# Patient Record
Sex: Male | Born: 1985 | Race: Black or African American | Hispanic: No | Marital: Single | Smoking: Never smoker
Health system: Southern US, Community
[De-identification: ages and names within clinical notes are randomized; demographics above are authoritative.]

## PROBLEM LIST (undated history)

## (undated) DIAGNOSIS — Z789 Other specified health status: Secondary | ICD-10-CM

## (undated) HISTORY — DX: Other specified health status: Z78.9

## (undated) HISTORY — PX: NO PAST SURGERIES: SHX2092

---
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
## 2006-11-14 ENCOUNTER — Emergency Department (HOSPITAL_COMMUNITY): Admission: EM | Admit: 2006-11-14 | Discharge: 2006-11-14 | Payer: Self-pay | Admitting: Emergency Medicine

## 2007-06-13 ENCOUNTER — Emergency Department (HOSPITAL_COMMUNITY): Admission: EM | Admit: 2007-06-13 | Discharge: 2007-06-13 | Payer: Self-pay | Admitting: Emergency Medicine
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
## 2011-08-19 ENCOUNTER — Telehealth: Payer: Self-pay

## 2011-08-19 NOTE — Telephone Encounter (Signed)
.  [facility] The patient called to request records of his past TB tests faxed to himself at [phone].  The patient may be reached by phone at [phone].

## 2011-08-20 NOTE — Telephone Encounter (Signed)
Faxed over 2011 and 2012 Tb Tests per the patients request.

## 2011-09-09 ENCOUNTER — Encounter (HOSPITAL_COMMUNITY): Payer: Self-pay | Admitting: Emergency Medicine

## 2011-09-09 ENCOUNTER — Emergency Department (HOSPITAL_COMMUNITY)
Admission: EM | Admit: 2011-09-09 | Discharge: 2011-09-09 | Disposition: A | Discharge status: Home / Self Care | Payer: BC Managed Care – PPO | Attending: Emergency Medicine | Admitting: Emergency Medicine

## 2011-09-09 ENCOUNTER — Emergency Department (HOSPITAL_COMMUNITY): Payer: BC Managed Care – PPO

## 2011-09-09 DIAGNOSIS — N50819 Testicular pain, unspecified: Secondary | ICD-10-CM

## 2011-09-09 DIAGNOSIS — N509 Disorder of male genital organs, unspecified: Secondary | ICD-10-CM | POA: Insufficient documentation

## 2011-09-09 LAB — URINALYSIS, ROUTINE W REFLEX MICROSCOPIC
Bilirubin Urine: NEGATIVE
Glucose, UA: NEGATIVE mg/dL
Hgb urine dipstick: NEGATIVE
Protein, ur: NEGATIVE mg/dL
Urobilinogen, UA: 1 mg/dL (ref 0.0–1.0)

## 2011-09-09 NOTE — Discharge Instructions (Signed)
Your exam today is negative for urinary tract infection or any structural abnormalities of your testicles.  Please follow-up with your primary care provider or with the urologist listed if symptoms persist or worsen.

## 2011-09-09 NOTE — ED Provider Notes (Signed)
History     CSN: [id number]  Arrival date & time 09/09/11  1907   First MD Initiated Contact with Patient 09/09/11 2034      Chief Complaint  Patient presents with  . Testicle Pain    (Consider location/radiation/quality/duration/timing/severity/associated sxs/prior treatment) Patient is a 26 y.o. male presenting with testicular pain.  Testicle Pain This is a recurrent problem. The current episode started 1 to 4 weeks ago. The problem occurs daily. The problem has been gradually worsening. Pertinent negatives include no fever or urinary symptoms. The symptoms are aggravated by nothing. He has tried nothing for the symptoms.  Patient reports frequent episodes of bilateral testicular discomfort, initially originating in left testicle and now presenting in right testicle.  Patient states his testicles feel "heavy".  Sexually active, monogamous, uses condoms.  Denies penile discharge, pain, dysuria.  History reviewed. No pertinent past medical history.  History reviewed. No pertinent past surgical history.  No family history on file.  History  Substance Use Topics  . Smoking status: Never Smoker   . Smokeless tobacco: Not on file  . Alcohol Use: Yes      Review of Systems  Constitutional: Negative for fever.  Genitourinary: Positive for testicular pain.  All other systems reviewed and are negative.    Allergies  Review of patient's allergies indicates no known allergies.  Home Medications  No current outpatient prescriptions on file.  BP 128/82  Pulse 58  Temp(Src) 98.3 F (36.8 C) (Oral)  Resp 16  SpO2 98%  Physical Exam  Nursing note and vitals reviewed. Constitutional: He is oriented to person, place, and time. He appears well-developed and well-nourished.  HENT:  Head: Normocephalic and atraumatic.  Eyes: Pupils are equal, round, and reactive to light.  Neck: Normal range of motion. Neck supple.  Cardiovascular: Normal rate, regular rhythm, normal heart  sounds and intact distal pulses.   Pulmonary/Chest: Effort normal and breath sounds normal.  Abdominal: Soft. Bowel sounds are normal.  Genitourinary: Penis normal. Cremasteric reflex is present. Right testis shows no mass and no swelling. Left testis shows tenderness. Left testis shows no mass and no swelling. No penile erythema or penile tenderness. No discharge found.  Musculoskeletal: Normal range of motion.  Neurological: He is alert and oriented to person, place, and time.  Skin: Skin is warm and dry.  Psychiatric: He has a normal mood and affect. His behavior is normal. Judgment and thought content normal.    ED Course  Procedures (including critical care time)   Labs Reviewed  URINALYSIS, ROUTINE W REFLEX MICROSCOPIC   US Scrotum  09/09/2011  *RADIOLOGY REPORT*  Clinical Data: Left testicular pain for 1 month.  SCROTAL ULTRASOUND DOPPLER ULTRASOUND OF THE TESTICLES  Technique:  Complete ultrasound examination of the testicles, epididymis, and other scrotal structures was performed.  Color and spectral Doppler ultrasound were also utilized to evaluate blood flow to the testicles.  Comparison:  None.  Findings:  The testicles are symmetric in size and echogenicity. The right testis measures 4.5 x 2.2 x 3.6 cm.  The left testis measures 4.5 x 2.1 x 3.1 cm. No testicular masses are seen.  There is a tiny calcification measuring about 1 mm diameter in the right testis. Both epididymal heads are unremarkable in appearance. There is no evidence of hydrocele, varicocele, or other extra- testicular abnormality.  Blood flow is seen within both testicles on color Doppler sonography.  Doppler spectral waveforms show both arterial and venous flow signal in both testicles.  IMPRESSION:  Tiny calcification in the right testis, likely to be of no clinical significance.  The examination is otherwise unremarkable.  No evidence of testicular mass or torsion.  Original Report Authenticated By: Marlon Pel, M.D.   [place] Art/ven Flow Abd Pelv Doppler  09/09/2011  *RADIOLOGY REPORT*  Clinical Data: Left testicular pain for 1 month.  SCROTAL ULTRASOUND DOPPLER ULTRASOUND OF THE TESTICLES  Technique:  Complete ultrasound examination of the testicles, epididymis, and other scrotal structures was performed.  Color and spectral Doppler ultrasound were also utilized to evaluate blood flow to the testicles.  Comparison:  None.  Findings:  The testicles are symmetric in size and echogenicity. The right testis measures 4.5 x 2.2 x 3.6 cm.  The left testis measures 4.5 x 2.1 x 3.1 cm. No testicular masses are seen.  There is a tiny calcification measuring about 1 mm diameter in the right testis. Both epididymal heads are unremarkable in appearance. There is no evidence of hydrocele, varicocele, or other extra- testicular abnormality.  Blood flow is seen within both testicles on color Doppler sonography.  Doppler spectral waveforms show both arterial and venous flow signal in both testicles.  IMPRESSION: Tiny calcification in the right testis, likely to be of no clinical significance.  The examination is otherwise unremarkable.  No evidence of testicular mass or torsion.  Original Report Authenticated By: Marlon Pel, M.D.     1. Testicle pain       MDM          Jimmye Norman, NP 09/09/11 [phone]

## 2011-09-09 NOTE — ED Notes (Signed)
Patient transported to Ultrasound 

## 2011-09-09 NOTE — ED Notes (Signed)
Patient states developed left testicle one month ago and pain worsening overtime and now started to have pain right testicle.  Patient 4-5/10 throbbing pain.  Denies dysuria how pain tip of penis.

## 2011-09-10 NOTE — ED Provider Notes (Signed)
Medical screening examination/treatment/procedure(s) were performed by non-physician practitioner and as supervising physician I was immediately available for consultation/collaboration.   Gwyneth Sprout, MD 09/10/11 2155

## 2011-09-27 ENCOUNTER — Emergency Department (HOSPITAL_COMMUNITY)
Admission: EM | Admit: 2011-09-27 | Discharge: 2011-09-27 | Disposition: A | Discharge status: Home / Self Care | Payer: BC Managed Care – PPO | Source: Home / Self Care | Attending: Emergency Medicine | Admitting: Emergency Medicine

## 2011-09-27 ENCOUNTER — Emergency Department (INDEPENDENT_AMBULATORY_CARE_PROVIDER_SITE_OTHER): Payer: BC Managed Care – PPO

## 2011-09-27 ENCOUNTER — Encounter (HOSPITAL_COMMUNITY): Payer: Self-pay | Admitting: Emergency Medicine

## 2011-09-27 DIAGNOSIS — K5900 Constipation, unspecified: Secondary | ICD-10-CM

## 2011-09-27 DIAGNOSIS — K2970 Gastritis, unspecified, without bleeding: Secondary | ICD-10-CM

## 2011-09-27 DIAGNOSIS — K2990 Gastroduodenitis, unspecified, without bleeding: Secondary | ICD-10-CM

## 2011-09-27 MED ORDER — BISACODYL 5 MG PO TBEC: 5.0000 mg | DELAYED_RELEASE_TABLET | Freq: Two times a day (BID) | ORAL | Status: AC

## 2011-09-27 MED ORDER — GI COCKTAIL ~~LOC~~
ORAL | Status: AC
  Filled 2011-09-27: qty 30

## 2011-09-27 MED ORDER — GLYCERIN (LAXATIVE) 2 G RE SUPP: 1.0000 | Freq: Once | RECTAL | Status: DC | PRN

## 2011-09-27 MED ORDER — POLYETHYLENE GLYCOL 3350 17 GM/SCOOP PO POWD: 17.0000 g | Freq: Every day | ORAL | Status: AC

## 2011-09-27 MED ORDER — GI COCKTAIL ~~LOC~~
30.0000 mL | Freq: Once | ORAL | Status: AC
  Administered 2011-09-27: 30 mL via ORAL

## 2011-09-27 MED ORDER — FAMOTIDINE 20 MG PO TABS: 20.0000 mg | ORAL_TABLET | Freq: Two times a day (BID) | ORAL | Status: DC

## 2011-09-27 MED ORDER — PANTOPRAZOLE SODIUM 40 MG PO TBEC: 40.0000 mg | DELAYED_RELEASE_TABLET | Freq: Every day | ORAL | Status: DC

## 2011-09-27 NOTE — ED Notes (Signed)
C/o patients says he been having left chest pain, radiates to the back, started about a month ago, pain intermittent. Pt says he been feeling weak, trouble breathing, better when standing or laying down, worst when sitting. Pt eats,drinks fine. Pt exercises regularly.

## 2011-09-27 NOTE — Discharge Instructions (Signed)
Drink extra fluids. It may take up to 3 days for the miralax to take effect. may also drink prune and apple juice. Return if you have a fever, if you start having severe abdominal pain, a fever >100.4, or any other concerns.   Go to www.goodrx.com to look up your medications. This will give you a list of where you can find your prescriptions at the most affordable prices.   

## 2011-09-27 NOTE — ED Provider Notes (Signed)
History     CSN: [id number]  Arrival date & time 09/27/11  1556   First MD Initiated Contact with Patient 09/27/11 1610      Chief Complaint  Patient presents with  . Chest Pain    (Consider location/radiation/quality/duration/timing/severity/associated sxs/prior treatment) HPI Comments: Pt with intermittent LUQ/gastric sharp pain over the past month. States gets worse and becomes nauseous with fasting, better with eating. Worse with sitting up straight, better with lying down and standing up and with aspirin. also took ibu w/o relief.. Not affected with exertion. States feels "pressure" in the mid back, which is better with taking a deep breath in.  No v, diaphoresis. No urinary complaints, BM have been "larger"  Than usual but normal color. Last time was as morning, and was normal for him. States that his abdomen has gotten "a little bigger," but denies significant distention. No alcohol use, excessive NSAID use. Patient is not a smoker. No h/o abd surgeries.  No FH CAD, MI.   ROS as noted in HPI. All other ROS negative.   Patient is a 26 y.o. male presenting with abdominal pain. The history is provided by the patient. No language interpreter was used.  Abdominal Pain The primary symptoms of the illness include abdominal pain. The current episode started more than 2 days ago. The onset of the illness was sudden. The problem has been gradually worsening.  The patient has not had a change in bowel habit. Symptoms associated with the illness do not include chills, anorexia, diaphoresis, constipation, urgency, hematuria, frequency or back pain. Significant associated medical issues do not include PUD, GERD or diabetes.    History reviewed. No pertinent past medical history.  History reviewed. No pertinent past surgical history.  History reviewed. No pertinent family history.  History  Substance Use Topics  . Smoking status: Never Smoker   . Smokeless tobacco: Not on file  . Alcohol  Use: Yes     occ      Review of Systems  Constitutional: Negative for chills and diaphoresis.  Gastrointestinal: Positive for abdominal pain. Negative for constipation and anorexia.  Genitourinary: Negative for urgency, frequency and hematuria.  Musculoskeletal: Negative for back pain.    Allergies  Review of patient's allergies indicates no known allergies.  Home Medications   Current Outpatient Rx  Name Route Sig Dispense Refill  . ASPIRIN 325 MG PO TABS Oral Take 325 mg by mouth daily.    Marland Kitchen BISACODYL 5 MG PO TBEC Oral Take 1 tablet (5 mg total) by mouth 2 (two) times daily. 14 tablet 0  . FAMOTIDINE 20 MG PO TABS Oral Take 1 tablet (20 mg total) by mouth 2 (two) times daily. 40 tablet 0  . GLYCERIN (LAXATIVE) 2 G RE SUPP Rectal Place 1 suppository rectally once as needed (constipation). 12 suppository 0  . PANTOPRAZOLE SODIUM 40 MG PO TBEC Oral Take 1 tablet (40 mg total) by mouth daily. 20 tablet 0  . POLYETHYLENE GLYCOL 3350 PO POWD Oral Take 17 g by mouth daily. 255 g 0    BP 134/76  Pulse 67  Temp 99.6 F (37.6 C) (Oral)  Resp 20  SpO2 100%  Physical Exam  Nursing note and vitals reviewed. Constitutional: He is oriented to person, place, and time. He appears well-developed and well-nourished.  HENT:  Head: Normocephalic and atraumatic.  Eyes: Conjunctivae and EOM are normal.  Neck: Normal range of motion.  Cardiovascular: Normal rate, regular rhythm, normal heart sounds and intact distal pulses.  Exam reveals no gallop and no friction rub.   No murmur heard. Pulmonary/Chest: Effort normal and breath sounds normal.  Abdominal: Soft. Normal appearance and bowel sounds are normal. He exhibits no distension and no mass. There is tenderness in the left upper quadrant. There is no rigidity, no rebound, no guarding, no CVA tenderness, no tenderness at McBurney's point and negative Murphy's sign.    Musculoskeletal: Normal range of motion.  Neurological: He is alert  and oriented to person, place, and time.  Skin: Skin is warm and dry. No rash noted.  Psychiatric: He has a normal mood and affect. His behavior is normal. Judgment and thought content normal.    ED Course  Procedures (including critical care time)   Labs Reviewed  POCT H PYLORI SCREEN  LAB REPORT - SCANNED   Dg Abd Acute W/chest  09/27/2011  *RADIOLOGY REPORT*  Clinical Data: Left lower quadrant abdominal pain.  Abdominal distention.  Left-sided chest pain.  ACUTE ABDOMEN SERIES (ABDOMEN 2 VIEW & CHEST 1 VIEW)  Comparison: Lumbar radiographs dated 11/14/2006  Findings: Heart size and vascularity are normal and the lungs are clear.  No free air or free fluid in the abdomen.  The bowel gas pattern is normal.  Stool scattered throughout the colon.  No abnormal abdominal calcifications.  No osseous abnormality.  IMPRESSION: Benign-appearing abdomen and chest.  Original Report Authenticated By: Gwynn Burly, M.D.   Results for orders placed during the hospital encounter of 09/27/11  POCT H PYLORI SCREEN      Component Value Range   H. PYLORI SCREEN, POC NEGATIVE  NEGATIVE     1. Constipation   2. Gastritis     MDM   EKG: Sinus bradycardia, rate 48, normal intervals, normal axis. LVH. Early repolarization. No previous EKGs for comparison.  EKG is normal, H. pylori negative. X-ray reviewed by myself. Report per radiology. Abdomen benign, no evidence of surgical abdomen at this time. H&P most consistent with gastritis and constipation. Doubt cardiac etiology of his pain, as EKG was obtained while patient symptomatic, and he has no other risk factors for ACS. X-ray shows diffuse constipation. Will start him on H2/PPI for gastritis, and time some stool softeners, MiraLax for his constipation. Discussed imaging, MDM, signs and symptoms that should prompt his return. Patient agrees with plan.  Luiz Blare, MD 09/28/11 [phone]

## 2012-01-23 ENCOUNTER — Ambulatory Visit (INDEPENDENT_AMBULATORY_CARE_PROVIDER_SITE_OTHER): Payer: BC Managed Care – PPO | Admitting: Family Medicine

## 2012-01-23 VITALS — BP 128/74 | HR 51 | Temp 98.0°F | Resp 17 | Ht 73.0 in | Wt 177.0 lb

## 2012-01-23 DIAGNOSIS — N50819 Testicular pain, unspecified: Secondary | ICD-10-CM

## 2012-01-23 DIAGNOSIS — R350 Frequency of micturition: Secondary | ICD-10-CM

## 2012-01-23 DIAGNOSIS — Z7251 High risk heterosexual behavior: Secondary | ICD-10-CM

## 2012-01-23 DIAGNOSIS — N509 Disorder of male genital organs, unspecified: Secondary | ICD-10-CM

## 2012-01-23 DIAGNOSIS — M549 Dorsalgia, unspecified: Secondary | ICD-10-CM

## 2012-01-23 LAB — POCT UA - MICROSCOPIC ONLY
Bacteria, U Microscopic: NEGATIVE
Casts, Ur, LPF, POC: NEGATIVE
Mucus, UA: NEGATIVE
Yeast, UA: NEGATIVE

## 2012-01-23 LAB — POCT URINALYSIS DIPSTICK
Bilirubin, UA: NEGATIVE
Blood, UA: NEGATIVE
Glucose, UA: NEGATIVE
Nitrite, UA: NEGATIVE
Spec Grav, UA: 1.025
pH, UA: 6.5

## 2012-01-23 NOTE — Progress Notes (Signed)
Subjective:    Patient ID: Tyler Tran, male    DOB: Aug 27, 1985, 26 y.o.   MRN: [id number]  HPI Tyler Tran is a 26 y.o. male Past 6 - 8 months with back problems, seen in the ER.  Told was constipated.  Seen again in the emergency room for different feeling in testicles - felt softer - had ultrasound done in ER, told was normal.   Then felt like testicles were hanging lower and could feel veins.  Did research online - worried that has std or other problem. Sexually active - 1 partner in past year, last sexually active 3 months ago.  No penile discharge.  Slight dribble after urinating.  Upset stomach at times or after workout. initially had some soreness in testicles, now only episodic sharp pains. Not continuous as in past.   No treatments.  Scrotal ultrasound 09/09/11: Findings: The testicles are symmetric in size and echogenicity.  The right testis measures 4.5 x 2.2 x 3.6 cm. The left testis  measures 4.5 x 2.1 x 3.1 cm. No testicular masses are seen. There  is a tiny calcification measuring about 1 mm diameter in the right  testis. Both epididymal heads are unremarkable in appearance.  There is no evidence of hydrocele, varicocele, or other extra-  testicular abnormality.  Blood flow is seen within both testicles on color Doppler  sonography. Doppler spectral waveforms show both arterial and  venous flow signal in both testicles.  IMPRESSION:  Tiny calcification in the right testis, likely to be of no clinical  significance. The examination is otherwise unremarkable. No  evidence of testicular mass or torsion.    [employer], broadcasting/film/video.  Nonsmoker. No herbal supplements, only protein shakes with workouts.   Review of Systems  Genitourinary: Positive for frequency and testicular pain. Negative for dysuria, urgency, hematuria, flank pain, discharge, penile swelling, scrotal swelling and penile pain.  Musculoskeletal: Negative for back pain.  Skin: Negative for rash.        Objective:   Physical Exam  Constitutional: He is oriented to person, place, and time. He appears well-developed and well-nourished.  Pulmonary/Chest: Effort normal.  Abdominal: Soft. Bowel sounds are normal. He exhibits no distension. There is no tenderness. There is no CVA tenderness. Hernia confirmed negative in the right inguinal area and confirmed negative in the left inguinal area.  Genitourinary: Penis normal. Right testis shows no mass, no swelling and no tenderness. Left testis shows no mass, no swelling and no tenderness.       small nontender lymph nodes bilaterral inguinal fold.   Musculoskeletal: Normal range of motion.  Neurological: He is alert and oriented to person, place, and time.  Skin: Skin is warm and dry. No rash noted.  Psychiatric: He has a normal mood and affect. His behavior is normal.      Results for orders placed in visit on 01/23/12  POCT UA - MICROSCOPIC ONLY      Component Value Range   WBC, Ur, HPF, POC 0-2     RBC, urine, microscopic 0-1     Bacteria, U Microscopic neg     Mucus, UA neg     Epithelial cells, urine per micros 0-1     Crystals, Ur, HPF, POC neg     Casts, Ur, LPF, POC neg     Yeast, UA neg    POCT URINALYSIS DIPSTICK      Component Value Range   Color, UA yellow     Clarity, UA clear  Glucose, UA neg     Bilirubin, UA neg     Ketones, UA neg     Spec Grav, UA 1.025     Blood, UA neg     pH, UA 6.5     Protein, UA neg     Urobilinogen, UA 0.2     Nitrite, UA neg     Leukocytes, UA Negative         Assessment & Plan:

## 2012-01-23 NOTE — Patient Instructions (Signed)
Your should receive a call or letter about your lab results within the next week to 10 days.  Return to the clinic or go to the nearest emergency room if any of your symptoms worsen or new symptoms occur.  

## 2012-01-24 LAB — PSA: PSA: 1.34 ng/mL (ref <=4.00)

## 2012-01-24 LAB — GC/CHLAMYDIA PROBE AMP, URINE: Chlamydia, Swab/Urine, PCR: NEGATIVE

## 2012-01-24 LAB — RPR

## 2012-06-03 ENCOUNTER — Ambulatory Visit (INDEPENDENT_AMBULATORY_CARE_PROVIDER_SITE_OTHER): Payer: BC Managed Care – PPO | Admitting: Family Medicine

## 2012-06-03 ENCOUNTER — Ambulatory Visit: Payer: BC Managed Care – PPO

## 2012-06-03 VITALS — BP 112/62 | HR 54 | Temp 97.4°F | Resp 16 | Ht 73.5 in | Wt 178.0 lb

## 2012-06-03 DIAGNOSIS — M542 Cervicalgia: Secondary | ICD-10-CM

## 2012-06-03 MED ORDER — CYCLOBENZAPRINE HCL 10 MG PO TABS: 10.0000 mg | ORAL_TABLET | Freq: Two times a day (BID) | ORAL | Status: DC | PRN

## 2012-06-03 NOTE — Progress Notes (Signed)
[facility] [address], [place] [address] [phone] [phone]  Date:  06/03/2012   Name:  Tyler Tran   DOB:  Oct 17, 1985   MRN:  [id number]  PCP:  Default, Provider, MD    Chief Complaint: Neck Pain   History of Present Illness:  Tyler Tran is a 27 y.o. very pleasant male patient who presents with the following:  He played basketball last night.  He noted a slight pain in his neck while playing.  He applied ice when he got home, then heat.  This am the pain was a lot worse.  It hurts a lot to move "in certain directions" or with certain movements.     It hurts on the right side of the neck only.   He did not notice any particular injury to his neck, no falls or other trauma No pain down his arms, no numbness or tingling in his arms, hands or fingers.    Generally he is quite healthy.   No headache, feels well otherwise  He last took ibuprofen last night- around 10 pm.  He was given 800 mg of ibuprofen here but has not yet taken it- told him it was fine to take the medication  There is no problem list on file for this patient.   No past medical history on file.  No past surgical history on file.  History  Substance Use Topics  . Smoking status: Never Smoker   . Smokeless tobacco: Not on file  . Alcohol Use: Yes     Comment: occ    No family history on file.  No Known Allergies  Medication list has been reviewed and updated.  Current Outpatient Prescriptions on File Prior to Visit  Medication Sig Dispense Refill  . aspirin 325 MG tablet Take 325 mg by mouth daily.      . famotidine (PEPCID) 20 MG tablet Take 1 tablet (20 mg total) by mouth 2 (two) times daily.  40 tablet  0  . glycerin adult (GLYCERIN ADULT) 2 G SUPP Place 1 suppository rectally once as needed (constipation).  12 suppository  0  . pantoprazole (PROTONIX) 40 MG tablet Take 1 tablet (40 mg total) by mouth daily.  20 tablet  0   No current facility-administered  medications on file prior to visit.    Review of Systems:  As per HPI- otherwise negative.   Physical Examination: Filed Vitals:   06/03/12 0855  BP: 112/62  Pulse: 54  Temp: 97.4 F (36.3 C)  Resp: 16   Filed Vitals:   06/03/12 0855  Height: 6' 1.5" (1.867 m)  Weight: 178 lb (80.74 kg)   Body mass index is 23.16 kg/(m^2). Ideal Body Weight: Weight in (lb) to have BMI = 25: 191.7  GEN: WDWN, NAD, Non-toxic, A & O x 3, looks well HEENT: Atraumatic, Normocephalic. Neck supple. No masses, No LAD. Bilateral TM partially obscured by cerumen, oropharynx normal.  PEERL,EOMI.   Ears and Nose: No external deformity. CV: RRR, No M/G/R. No JVD. No thrill. No extra heart sounds. PULM: CTA B, no wheezes, crackles, rhonchi. No retractions. No resp. distress. No accessory muscle use.Marland Kitchen EXTR: No c/c/e NEURO Normal gait.  PSYCH: Normally interactive. Conversant. Not depressed or anxious appearing.  Calm demeanor.  He is tender in the right paraspinous muscles of the neck.  He has 45 degrees of roation to the left, about 30 degrees to the right.  Extension and flexion are slightly restricted  by pain.   Normal strength, sensation, and dtr all extrmities, normal facial motion and sensation.  Normal RAM, normal shin rub  [facility] reading (PRIMARY) by  Dr. Patsy Lager. Cervical spine: normal  *RADIOLOGY REPORT*  Clinical Data: Neck pain. No acute injury.  CERVICAL SPINE - COMPLETE 4+ VIEW  Comparison: None.  Findings: The prevertebral soft tissues are normal. The alignment is anatomic through T1. There is no evidence of acute fracture or subluxation. The C1-C2 articulation appears normal in the AP projection. No osseous foraminal stenosis is demonstrated on the oblique views.  IMPRESSION: Normal cervical spine radiographs. No evidence of acute fracture, subluxation or static signs instability.  Soft cervical collar feels "great" to him   Assessment and Plan: Neck pain - Plan: DG Cervical  Spine Complete, cyclobenzaprine (FLEXERIL) 10 MG tablet  Crick in the neck- treat with cervical collar, heat/ ice and flexeril.   See patient instructions for more details.     Abbe Amsterdam, MD

## 2012-06-03 NOTE — Patient Instructions (Addendum)
Wear the collar as needed, and continue to use heat/ ice for your neck.    Use the muscle relaxer as needed, but be aware it can cause drowsiness.  Let me know if you are not improved within 24 - 48 hours, sooner if you develop a severe headache or any other symptoms

## 2013-08-04 ENCOUNTER — Ambulatory Visit: Payer: BC Managed Care – PPO

## 2013-12-16 ENCOUNTER — Telehealth: Payer: Self-pay

## 2013-12-16 NOTE — Telephone Encounter (Signed)
Patient left voicemail on Thursday 12/15/13 requesting status of his request for Medical Records.  Called patient and LMOVM for him to return the call.    [phone]

## 2013-12-20 NOTE — Telephone Encounter (Signed)
Records request has already been processed. I personally left voicemail for patient last week.

## 2022-12-30 ENCOUNTER — Ambulatory Visit: Payer: BC Managed Care – PPO | Admitting: Family Medicine

## 2023-01-07 ENCOUNTER — Encounter: Payer: Self-pay | Admitting: Family Medicine

## 2023-01-07 ENCOUNTER — Ambulatory Visit: Payer: BC Managed Care – PPO | Admitting: Family Medicine

## 2023-01-07 ENCOUNTER — Other Ambulatory Visit: Payer: Self-pay | Admitting: Family Medicine

## 2023-01-07 VITALS — BP 121/80 | HR 60 | Temp 98.9°F | Ht 74.0 in | Wt 235.4 lb

## 2023-01-07 DIAGNOSIS — E785 Hyperlipidemia, unspecified: Secondary | ICD-10-CM

## 2023-01-07 DIAGNOSIS — Z0000 Encounter for general adult medical examination without abnormal findings: Secondary | ICD-10-CM

## 2023-01-07 DIAGNOSIS — Z0001 Encounter for general adult medical examination with abnormal findings: Secondary | ICD-10-CM

## 2023-01-07 DIAGNOSIS — Z23 Encounter for immunization: Secondary | ICD-10-CM | POA: Diagnosis not present

## 2023-01-07 DIAGNOSIS — D72829 Elevated white blood cell count, unspecified: Secondary | ICD-10-CM

## 2023-01-07 DIAGNOSIS — Z1159 Encounter for screening for other viral diseases: Secondary | ICD-10-CM

## 2023-01-07 LAB — COMPREHENSIVE METABOLIC PANEL
ALT: 22 U/L (ref 0–53)
AST: 21 U/L (ref 0–37)
Albumin: 4.5 g/dL (ref 3.5–5.2)
Alkaline Phosphatase: 47 U/L (ref 39–117)
BUN: 13 mg/dL (ref 6–23)
CO2: 32 mEq/L (ref 19–32)
Calcium: 9.7 mg/dL (ref 8.4–10.5)
Chloride: 98 mEq/L (ref 96–112)
Creatinine, Ser: 1.19 mg/dL (ref 0.40–1.50)
GFR: 78.95 mL/min (ref >60.00)
Glucose, Bld: 91 mg/dL (ref 70–99)
Potassium: 4.2 mEq/L (ref 3.5–5.1)
Sodium: 137 mEq/L (ref 135–145)
Total Bilirubin: 0.5 mg/dL (ref 0.2–1.2)
Total Protein: 7.8 g/dL (ref 6.0–8.3)

## 2023-01-07 LAB — CBC
HCT: 47 % (ref 39.0–52.0)
Hemoglobin: 15.4 g/dL (ref 13.0–17.0)
MCHC: 32.8 g/dL (ref 30.0–36.0)
MCV: 94.3 fl (ref 78.0–100.0)
Platelets: 212 10*3/uL (ref 150.0–400.0)
RBC: 4.98 Mil/uL (ref 4.22–5.81)
RDW: 13 % (ref 11.5–15.5)
WBC: 2.7 10*3/uL — ABNORMAL LOW (ref 4.0–10.5)

## 2023-01-07 LAB — LIPID PANEL
Cholesterol: 234 mg/dL — ABNORMAL HIGH (ref 0–200)
HDL: 45.9 mg/dL (ref >39.00)
LDL Cholesterol: 130 mg/dL — ABNORMAL HIGH (ref 0–99)
NonHDL: 188.02
Total CHOL/HDL Ratio: 5
Triglycerides: 291 mg/dL — ABNORMAL HIGH (ref 0.0–149.0)
VLDL: 58.2 mg/dL — ABNORMAL HIGH (ref 0.0–40.0)

## 2023-01-07 NOTE — Progress Notes (Signed)
Chief Complaint  Patient presents with   New Patient (Initial Visit)   Insomnia    Well Male Tyler Tran is here for a complete physical.   His last physical was >1 year ago.  Current diet: in general, a "fair" diet.   Current exercise: lifting wts Weight trend: stable Fatigue out of ordinary? No. Seat belt? Yes.   Advanced directive? No  Health maintenance Tetanus- No HIV- Yes Hep C- No  Past Medical History:  Diagnosis Date   No known health problems      Past Surgical History:  Procedure Laterality Date   NO PAST SURGERIES      Medications  Takes no meds routinely.     Allergies No Known Allergies  Family History Family History  Problem Relation Age of Onset   Heart disease Maternal Grandmother    Heart disease Paternal Grandmother    Diabetes Paternal Grandmother    Cancer Neg Hx     Review of Systems: Constitutional: no fevers or chills Eye:  no recent significant change in vision Ear/Nose/Mouth/Throat:  Ears:  no hearing loss Nose/Mouth/Throat:  no complaints of nasal congestion, no sore throat Cardiovascular:  no chest pain Respiratory:  no shortness of breath Gastrointestinal:  no abdominal pain, no change in bowel habits GU:  Male: negative for dysuria Musculoskeletal/Extremities:  no pain of the joints Integumentary (Skin/Breast):  no abnormal skin lesions reported Neurologic:  no headaches Endocrine: No unexpected weight changes Hematologic/Lymphatic:  no night sweats  Exam BP 121/80 (BP Location: Left Arm, Patient Position: Sitting, Cuff Size: Large)   Pulse 60   Temp 98.9 F (37.2 C) (Oral)   Ht 6\' 2"  (1.88 m)   Wt 235 lb 6 oz (106.8 kg)   SpO2 98%   BMI 30.22 kg/m  General:  well developed, well nourished, in no apparent distress Skin:  no significant moles, warts, or growths Head:  no masses, lesions, or tenderness Eyes:  pupils equal and round, sclera anicteric without injection Ears:  canals without lesions, TMs shiny  without retraction, no obvious effusion, no erythema Nose:  nares patent, mucosa normal Throat/Pharynx:  lips and gingiva without lesion; tongue and uvula midline; non-inflamed pharynx; no exudates or postnasal drainage Neck: neck supple without adenopathy, thyromegaly, or masses Lungs:  clear to auscultation, breath sounds equal bilaterally, no respiratory distress Cardio:  regular rate and rhythm, no bruits, no LE edema Abdomen:  abdomen soft, nontender; bowel sounds normal; no masses or organomegaly Genital (male): Deferred Rectal: Deferred Musculoskeletal:  symmetrical muscle groups noted without atrophy or deformity Extremities:  no clubbing, cyanosis, or edema, no deformities, no skin discoloration Neuro:  gait normal; deep tendon reflexes normal and symmetric Psych: well oriented with normal range of affect and appropriate judgment/insight  Assessment and Plan  Well adult exam - Plan: CBC, Comprehensive metabolic panel, Lipid panel  Encounter for hepatitis C screening test for low risk patient - Plan: Hepatitis C antibody   Well 37 y.o. male. Counseled on diet and exercise. Self testicular exams recommended at least monthly.  Tdap today.  Mentioned insomnia. Offered medication, referral. He politely declined. He is following w a therapist.  He would like to think about it and let me know. Advanced directive form provided today.  Other orders as above. Follow up in 1 year pending the above workup. The patient voiced understanding and agreement to the plan.  Jilda Roche [place], DO 01/07/23 1:26 PM

## 2023-01-07 NOTE — Patient Instructions (Addendum)
Give [place] 2-3 business days to get the results of your labs back.   Keep the diet clean and stay active.  Aim to do some physical exertion for 150 minutes per week. This is typically divided into 5 days per week, 30 minutes per day. The activity should be enough to get your heart rate up. Anything is better than nothing if you have time constraints.  Start stretching routinely.   Do monthly self testicular checks in the shower. You are feeling for lumps/bumps that don't belong. If you feel anything like this, let me know!  Please get me a copy of your advanced directive form at your convenience.   If you want to see a sleep specialist or consider starting a medication, please let me know.   Sleep Hygiene Tips: Do not watch TV or look at screens within 1 hour of going to bed. If you do, make sure there is a blue light filter (nighttime mode) involved. Try to go to bed around the same time every night. Wake up at the same time within 1 hour of regular time. Ex: If you wake up at 7 AM for work, do not sleep past 8 AM on days that you don't work. Do not drink alcohol before bedtime. Do not consume caffeine-containing beverages after noon or within 9 hours of intended bedtime. Get regular exercise/physical activity in your life, but not within 2 hours of planned bedtime. Do not take naps.  Do not eat within 2 hours of planned bedtime. Melatonin, 3-5 mg 30-60 minutes before planned bedtime may be helpful.  The bed should be for sleep or sex only. If after 20-30 minutes you are unable to fall asleep, get up and do something relaxing. Do this until you feel ready to go to sleep again.   Let us know if you need anything.

## 2023-01-08 LAB — HEPATITIS C ANTIBODY: Hepatitis C Ab: NONREACTIVE

## 2023-02-18 ENCOUNTER — Other Ambulatory Visit (INDEPENDENT_AMBULATORY_CARE_PROVIDER_SITE_OTHER): Payer: BC Managed Care – PPO

## 2023-02-18 DIAGNOSIS — D72829 Elevated white blood cell count, unspecified: Secondary | ICD-10-CM | POA: Diagnosis not present

## 2023-02-18 DIAGNOSIS — E785 Hyperlipidemia, unspecified: Secondary | ICD-10-CM | POA: Diagnosis not present

## 2023-02-18 LAB — LIPID PANEL
Cholesterol: 222 mg/dL — ABNORMAL HIGH (ref 0–200)
HDL: 51 mg/dL (ref >39.00)
LDL Cholesterol: 130 mg/dL — ABNORMAL HIGH (ref 0–99)
NonHDL: 171.01
Total CHOL/HDL Ratio: 4
Triglycerides: 204 mg/dL — ABNORMAL HIGH (ref 0.0–149.0)
VLDL: 40.8 mg/dL — ABNORMAL HIGH (ref 0.0–40.0)

## 2023-02-18 LAB — CBC WITH DIFFERENTIAL/PLATELET
Basophils Absolute: 0 10*3/uL (ref 0.0–0.1)
Basophils Relative: 0.5 % (ref 0.0–3.0)
Eosinophils Absolute: 0 10*3/uL (ref 0.0–0.7)
Eosinophils Relative: 1.1 % (ref 0.0–5.0)
HCT: 46.7 % (ref 39.0–52.0)
Hemoglobin: 15.2 g/dL (ref 13.0–17.0)
Lymphocytes Relative: 47 % — ABNORMAL HIGH (ref 12.0–46.0)
Lymphs Abs: 1.6 10*3/uL (ref 0.7–4.0)
MCHC: 32.5 g/dL (ref 30.0–36.0)
MCV: 94.3 fL (ref 78.0–100.0)
Monocytes Absolute: 0.6 10*3/uL (ref 0.1–1.0)
Monocytes Relative: 16.1 % — ABNORMAL HIGH (ref 3.0–12.0)
Neutro Abs: 1.2 10*3/uL — ABNORMAL LOW (ref 1.4–7.7)
Neutrophils Relative %: 35.3 % — ABNORMAL LOW (ref 43.0–77.0)
Platelets: 226 10*3/uL (ref 150.0–400.0)
RBC: 4.95 Mil/uL (ref 4.22–5.81)
RDW: 12.9 % (ref 11.5–15.5)
WBC: 3.4 10*3/uL — ABNORMAL LOW (ref 4.0–10.5)

## 2023-11-27 ENCOUNTER — Ambulatory Visit (INDEPENDENT_AMBULATORY_CARE_PROVIDER_SITE_OTHER): Admitting: Family Medicine

## 2023-11-27 VITALS — BP 118/86 | HR 76 | Temp 98.1°F | Resp 18 | Ht 74.0 in | Wt 242.0 lb

## 2023-11-27 DIAGNOSIS — Z0000 Encounter for general adult medical examination without abnormal findings: Secondary | ICD-10-CM

## 2023-11-27 NOTE — Patient Instructions (Addendum)
Give [place] 2-3 business days to get the results of your labs back.   Keep the diet clean and stay active.  I recommend getting the flu shot in mid October. This suggestion would change if the CDC comes out with a different recommendation.   Please get me a copy of your advanced directive form at your convenience.   OK to use Debrox (peroxide) in the ear to loosen up wax. Also recommend using a bulb syringe (for removing boogers from baby's noses) to flush through warm water and vinegar (3-4:1 ratio). An alternative, though more expensive, is an elephant ear washer wax removal kit. Do not use Q-tips as this can impact wax further.  Let us know if you need anything.

## 2023-11-27 NOTE — Addendum Note (Signed)
 Addended by: TRUDY CURVIN RAMAN on: 11/27/2023 03:15 PM   Modules accepted: Orders

## 2023-11-27 NOTE — Progress Notes (Signed)
 Chief Complaint  Patient presents with   Annual Exam    Well Male Tyler Tran is here for a complete physical.   His last physical was >1 year ago.  Current diet: in general, a healthy diet.   Current exercise: swimming, running, lifting wts Weight trend: stable Fatigue out of ordinary? No. Seat belt? Yes.   Advanced directive? No  Health maintenance Tetanus- Yes HIV- Yes Hep C- Yes  Past Medical History:  Diagnosis Date   No known health problems      Past Surgical History:  Procedure Laterality Date   NO PAST SURGERIES      Medications  Takes no meds routinely.     Allergies No Known Allergies  Family History Family History  Problem Relation Age of Onset   Heart disease Maternal Grandmother    Heart disease Paternal Grandmother    Diabetes Paternal Grandmother    Cancer Neg Hx     Review of Systems: Constitutional: no fevers or chills Eye:  no recent significant change in vision Ear/Nose/Mouth/Throat:  Ears:  no hearing loss Nose/Mouth/Throat:  no complaints of nasal congestion, no sore throat Cardiovascular:  no chest pain Respiratory:  no shortness of breath Gastrointestinal:  no abdominal pain, no change in bowel habits GU:  Male: negative for dysuria Musculoskeletal/Extremities:  no pain of the joints Integumentary (Skin/Breast):  no abnormal skin lesions reported Neurologic:  no headaches Endocrine: No unexpected weight changes Hematologic/Lymphatic:  no night sweats  Exam BP 118/86   Pulse 76   Temp 98.1 F (36.7 C)   Resp 18   Ht 6' 2 (1.88 m)   Wt 242 lb (109.8 kg)   SpO2 99%   BMI 31.07 kg/m  General:  well developed, well nourished, in no apparent distress Skin:  no significant moles, warts, or growths Head:  no masses, lesions, or tenderness Eyes:  pupils equal and round, sclera anicteric without injection Ears:  canals without lesions, TMs shiny without retraction, no obvious effusion, no erythema Nose:  nares patent,  mucosa normal Throat/Pharynx:  lips and gingiva without lesion; tongue and uvula midline; non-inflamed pharynx; no exudates or postnasal drainage Neck: neck supple without adenopathy, thyromegaly, or masses Lungs:  clear to auscultation, breath sounds equal bilaterally, no respiratory distress Cardio:  regular rate and rhythm, no bruits, no LE edema Abdomen:  abdomen soft, nontender; bowel sounds normal; no masses or organomegaly Genital (male): Deferred Rectal: Deferred Musculoskeletal:  symmetrical muscle groups noted without atrophy or deformity Extremities:  no clubbing, cyanosis, or edema, no deformities, no skin discoloration Neuro:  gait normal; deep tendon reflexes normal and symmetric Psych: well oriented with normal range of affect and appropriate judgment/insight  Assessment and Plan  Well adult exam - Plan: CBC, Comprehensive metabolic panel with GFR, Lipid panel   Well 38 y.o. male. Counseled on diet and exercise. Advanced directive form provided today.  Self testicular exams recommended at least monthly.  Other orders as above. Follow up in 1 year pending the above workup. The patient voiced understanding and agreement to the plan.  Mabel [place], DO 11/27/23 2:54 PM

## 2023-11-28 ENCOUNTER — Ambulatory Visit: Payer: Self-pay | Admitting: Family Medicine

## 2023-11-28 LAB — CBC
HCT: 46.1 % (ref 38.5–50.0)
Hemoglobin: 15.5 g/dL (ref 13.2–17.1)
MCH: 31.6 pg (ref 27.0–33.0)
MCHC: 33.6 g/dL (ref 32.0–36.0)
MCV: 93.9 fL (ref 80.0–100.0)
MPV: 10.8 fL (ref 7.5–12.5)
Platelets: 224 Thousand/uL (ref 140–400)
RBC: 4.91 Million/uL (ref 4.20–5.80)
RDW: 12.8 % (ref 11.0–15.0)
WBC: 3.1 Thousand/uL — ABNORMAL LOW (ref 3.8–10.8)

## 2023-11-28 LAB — COMPREHENSIVE METABOLIC PANEL WITH GFR
AG Ratio: 1.4 (calc) (ref 1.0–2.5)
ALT: 26 U/L (ref 9–46)
AST: 26 U/L (ref 10–40)
Albumin: 4.7 g/dL (ref 3.6–5.1)
Alkaline phosphatase (APISO): 40 U/L (ref 36–130)
BUN/Creatinine Ratio: 18 (calc) (ref 6–22)
BUN: 24 mg/dL (ref 7–25)
CO2: 28 mmol/L (ref 20–32)
Calcium: 9.6 mg/dL (ref 8.6–10.3)
Chloride: 100 mmol/L (ref 98–110)
Creat: 1.35 mg/dL — ABNORMAL HIGH (ref 0.60–1.26)
Globulin: 3.4 g/dL (ref 1.9–3.7)
Glucose, Bld: 87 mg/dL (ref 65–99)
Potassium: 4.3 mmol/L (ref 3.5–5.3)
Sodium: 137 mmol/L (ref 135–146)
Total Bilirubin: 0.5 mg/dL (ref 0.2–1.2)
Total Protein: 8.1 g/dL (ref 6.1–8.1)
eGFR: 70 mL/min/1.73m2 (ref >=60)

## 2023-11-28 LAB — LIPID PANEL
Cholesterol: 226 mg/dL — ABNORMAL HIGH (ref <200)
HDL: 43 mg/dL (ref >=40)
LDL Cholesterol (Calc): 150 mg/dL — ABNORMAL HIGH
Non-HDL Cholesterol (Calc): 183 mg/dL — ABNORMAL HIGH (ref <130)
Total CHOL/HDL Ratio: 5.3 (calc) — ABNORMAL HIGH (ref <5.0)
Triglycerides: 189 mg/dL — ABNORMAL HIGH (ref <150)

## 2023-12-01 ENCOUNTER — Other Ambulatory Visit: Payer: Self-pay

## 2023-12-01 DIAGNOSIS — E7800 Pure hypercholesterolemia, unspecified: Secondary | ICD-10-CM

## 2023-12-02 ENCOUNTER — Other Ambulatory Visit: Payer: Self-pay

## 2023-12-02 DIAGNOSIS — E7800 Pure hypercholesterolemia, unspecified: Secondary | ICD-10-CM

## 2024-02-18 ENCOUNTER — Encounter: Payer: Self-pay | Admitting: Family Medicine

## 2024-02-22 ENCOUNTER — Other Ambulatory Visit (INDEPENDENT_AMBULATORY_CARE_PROVIDER_SITE_OTHER)

## 2024-02-22 ENCOUNTER — Ambulatory Visit: Payer: Self-pay | Admitting: Family Medicine

## 2024-02-22 DIAGNOSIS — E7800 Pure hypercholesterolemia, unspecified: Secondary | ICD-10-CM

## 2024-02-22 LAB — LIPID PANEL
Cholesterol: 214 mg/dL — ABNORMAL HIGH (ref 0–200)
HDL: 44.4 mg/dL (ref >39.00)
LDL Cholesterol: 136 mg/dL — ABNORMAL HIGH (ref 0–99)
NonHDL: 169.21
Total CHOL/HDL Ratio: 5
Triglycerides: 168 mg/dL — ABNORMAL HIGH (ref 0.0–149.0)
VLDL: 33.6 mg/dL (ref 0.0–40.0)

## 2024-02-26 ENCOUNTER — Telehealth: Admitting: Family Medicine

## 2024-02-26 ENCOUNTER — Encounter: Payer: Self-pay | Admitting: Family Medicine

## 2024-02-26 DIAGNOSIS — R519 Headache, unspecified: Secondary | ICD-10-CM

## 2024-02-26 NOTE — Progress Notes (Signed)
 Chief Complaint  Patient presents with   Headache    Headaches    Subjective: Patient is a 38 y.o. male here for f/u. We are interacting via web portal for an electronic face-to-face visit. I verified patient's ID using 2 identifiers. Patient agreed to proceed with visit via this method. Patient is at his office at work, I am at office. Patient and I are present for visit.   Patient has a 2-year history of headaches.  They are particularly triggered by bright light.  He tried wearing sunglasses in the car but still happens.  He is requesting his glasses be attempted.  He is not currently taking any medication.  He does not currently have a headache.  No known history of migraines.  No neurologic signs or symptoms.  Past Medical History:  Diagnosis Date   No known health problems     Objective: No conversational dyspnea Age appropriate judgment and insight Nml affect and mood  Assessment and Plan: Chronic daily headache  Chronic, not currently controlled.  Will follow-up form for allowing him to tint his cars.  Magnesium 200-500 mg daily recommended as well.  Not sure if therapy would be particular beneficial at this time.  Follow-up as needed. The patient voiced understanding and agreement to the plan.  Mabel Mt [place], DO 02/26/24  9:38 AM

## 2024-03-18 ENCOUNTER — Telehealth: Payer: Self-pay

## 2024-03-18 NOTE — Telephone Encounter (Signed)
 Pt come by picked up forms for [employer].

## 2024-11-28 ENCOUNTER — Encounter: Admitting: Family Medicine
